# Patient Record
Sex: Female | Born: 1935 | Race: White | Hispanic: No | Marital: Married | Smoking: Never smoker
Health system: Southern US, Community
[De-identification: ages and names within clinical notes are randomized; demographics above are authoritative.]

---

## 1999-10-23 ENCOUNTER — Encounter: Admission: RE | Admit: 1999-10-23 | Discharge: 1999-10-23 | Payer: Self-pay | Admitting: Obstetrics and Gynecology

## 1999-10-23 ENCOUNTER — Encounter: Payer: Self-pay | Admitting: Obstetrics and Gynecology

## 2000-12-09 ENCOUNTER — Encounter: Payer: Self-pay | Admitting: Obstetrics and Gynecology

## 2000-12-09 ENCOUNTER — Encounter: Admission: RE | Admit: 2000-12-09 | Discharge: 2000-12-09 | Payer: Self-pay | Admitting: Obstetrics and Gynecology

## 2002-01-10 ENCOUNTER — Encounter: Payer: Self-pay | Admitting: Obstetrics and Gynecology

## 2002-01-10 ENCOUNTER — Encounter: Admission: RE | Admit: 2002-01-10 | Discharge: 2002-01-10 | Payer: Self-pay | Admitting: Obstetrics and Gynecology

## 2003-01-13 ENCOUNTER — Encounter: Admission: RE | Admit: 2003-01-13 | Discharge: 2003-01-13 | Payer: Self-pay | Admitting: Obstetrics and Gynecology

## 2003-01-13 ENCOUNTER — Encounter: Payer: Self-pay | Admitting: Obstetrics and Gynecology

## 2003-08-17 ENCOUNTER — Inpatient Hospital Stay (HOSPITAL_COMMUNITY): Admission: EM | Admit: 2003-08-17 | Discharge: 2003-08-19 | Payer: Self-pay | Admitting: Emergency Medicine

## 2003-08-30 ENCOUNTER — Ambulatory Visit (HOSPITAL_COMMUNITY): Admission: RE | Admit: 2003-08-30 | Discharge: 2003-08-30 | Payer: Self-pay | Admitting: Family Medicine

## 2003-08-31 ENCOUNTER — Inpatient Hospital Stay (HOSPITAL_COMMUNITY): Admission: EM | Admit: 2003-08-31 | Discharge: 2003-09-12 | Payer: Self-pay | Admitting: Emergency Medicine

## 2003-09-04 ENCOUNTER — Encounter (INDEPENDENT_AMBULATORY_CARE_PROVIDER_SITE_OTHER): Payer: Self-pay | Admitting: *Deleted

## 2003-09-07 ENCOUNTER — Encounter (INDEPENDENT_AMBULATORY_CARE_PROVIDER_SITE_OTHER): Payer: Self-pay | Admitting: *Deleted

## 2003-09-19 ENCOUNTER — Encounter: Admission: RE | Admit: 2003-09-19 | Discharge: 2003-09-19 | Payer: Self-pay | Admitting: Thoracic Surgery

## 2003-10-10 ENCOUNTER — Encounter: Admission: RE | Admit: 2003-10-10 | Discharge: 2003-10-10 | Payer: Self-pay | Admitting: Thoracic Surgery

## 2004-01-10 ENCOUNTER — Encounter: Admission: RE | Admit: 2004-01-10 | Discharge: 2004-01-10 | Payer: Self-pay | Admitting: Thoracic Surgery

## 2004-01-18 ENCOUNTER — Encounter: Admission: RE | Admit: 2004-01-18 | Discharge: 2004-01-18 | Payer: Self-pay | Admitting: Obstetrics and Gynecology

## 2005-02-04 ENCOUNTER — Encounter: Admission: RE | Admit: 2005-02-04 | Discharge: 2005-02-04 | Payer: Self-pay | Admitting: Obstetrics and Gynecology

## 2005-03-20 IMAGING — CR DG CHEST DECUBITUS*L*
1 series · 1 of 1 positions shown · non-contrast
Comparison: none

CLINICAL DATA: Pneumonia.
 CHEST, TWO VIEWS 09/03/03
 Two views of the chest are compared to a chest x-ray of 09/01/03.  There is little change in right pleural effusion and right basilar atelectasis.  Very small left effusion is noted.  The heart is within normal limits in size. 
 IMPRESSION
 Little change in volume of right pleural effusion with right basilar atelectasis.  Very small left effusion.
 CHEST LEFT DECUBITUS 09/03/03
 A left lateral decubitus view of the chest shows no significant free-flowing left effusion to be present. 
 No significant free-flowing left effusion. 
 CHEST RIGHT DECUBITUS 09/03/03
 A right lateral decubitus view of the chest shows a moderate size free-flowing right pleural effusion to be present. 
 Moderate size free-flowing right pleural effusion.

[view not recorded]
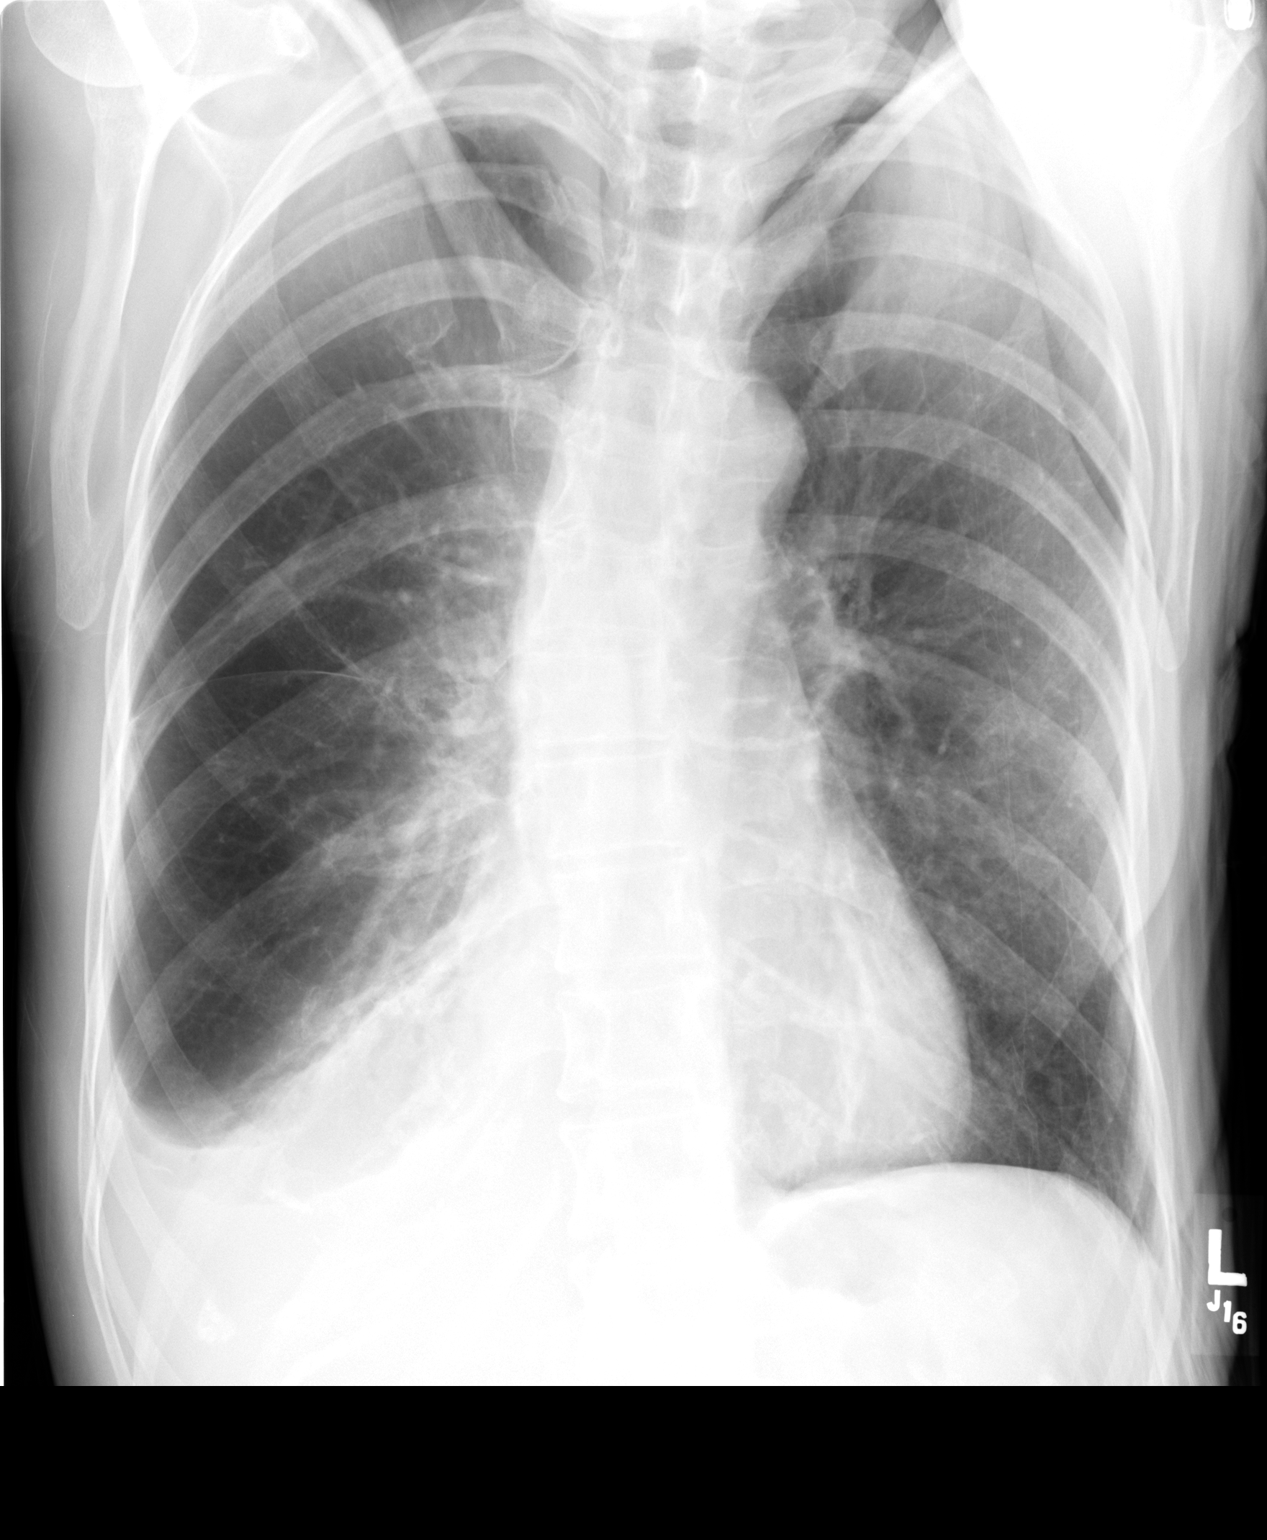

[1 of 1 positions shown; findings below may reference images not displayed]

## 2005-03-21 IMAGING — CR DG CHEST 1V PORT
1 series · 1 of 1 positions shown · non-contrast
Comparison: none

CLINICAL DATA: Post-bronchoscopy. 
 CHEST, PORTABLE ONE VIEW   
 An AP view of the chest made with the portable apparatus on September 04, 2003 is compared with a prior study of the previous day.  Large amount of effusion is still present on the right with some loss of volume of the right lower lobe with some increased density which may represent compression atelectasis.  No evidence of pneumothorax is noted. 
 IMPRESSION
 No evidence of pneumothorax after bronchoscopy.  Large right pleural effusion persists.

[view not recorded]
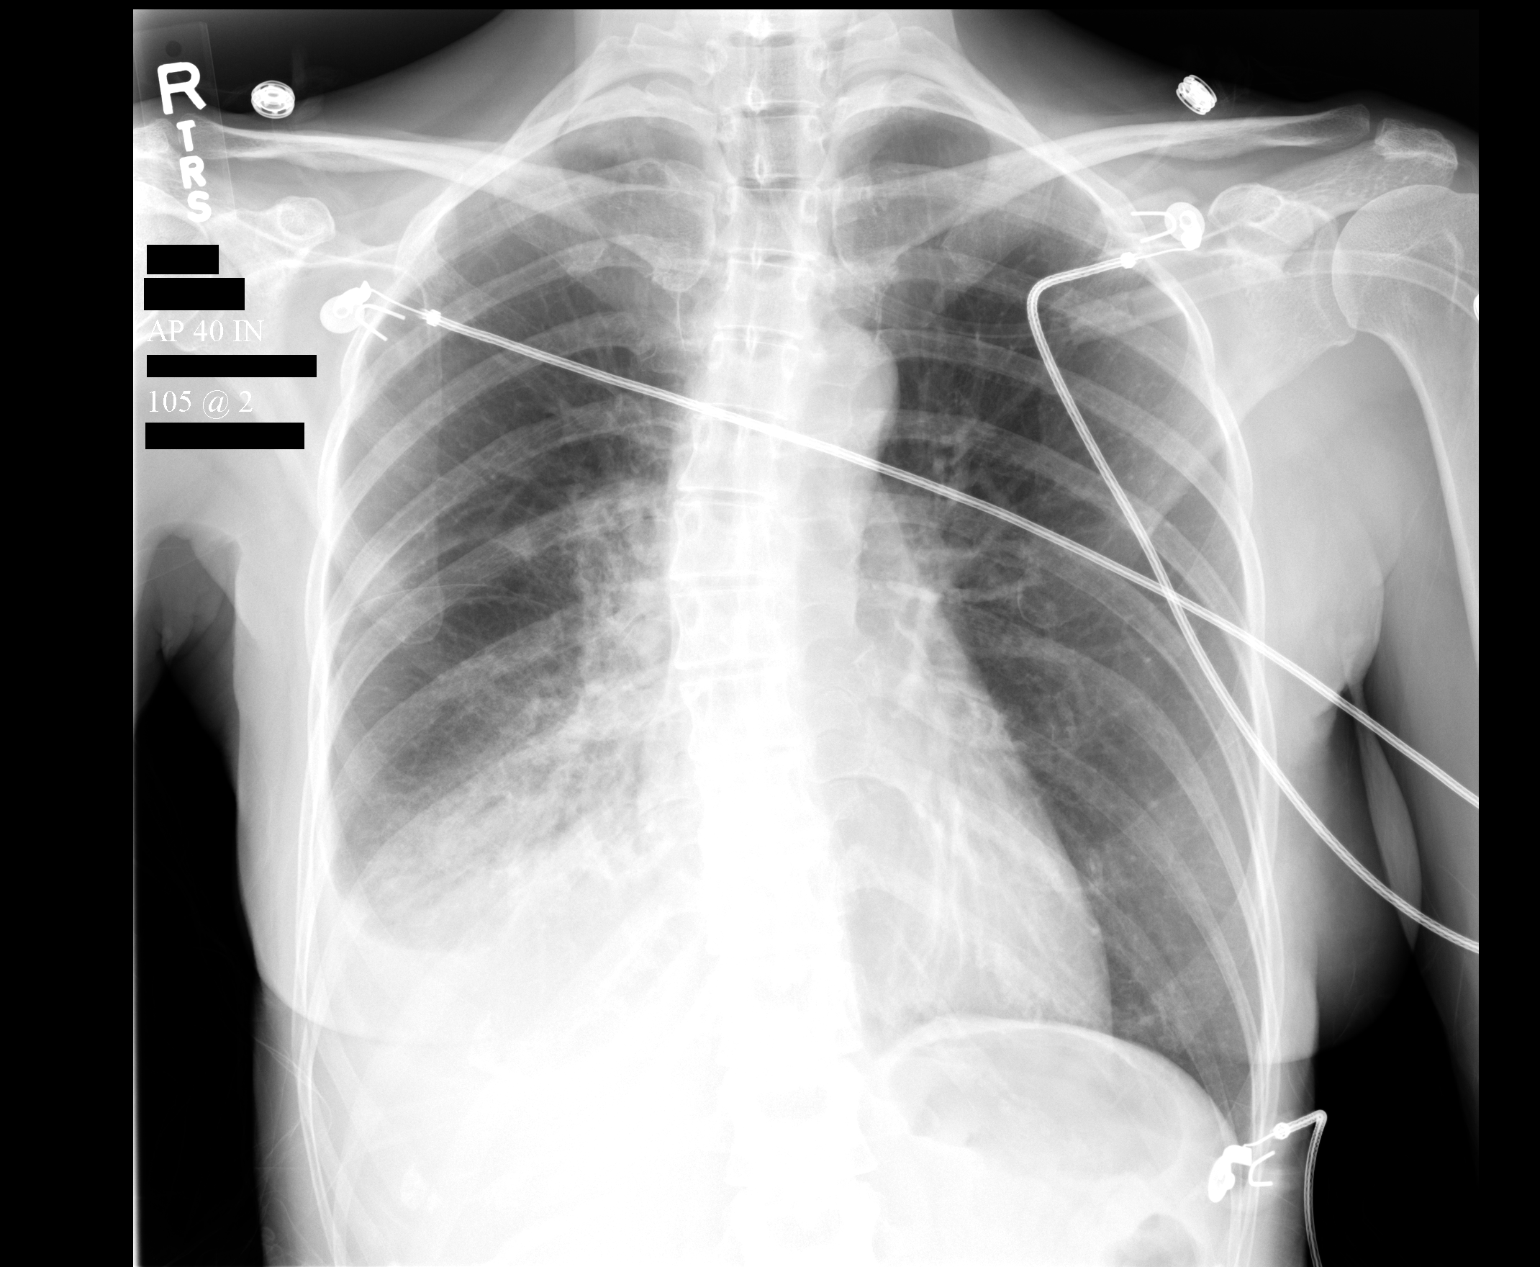

[1 of 1 positions shown; findings below may reference images not displayed]

## 2005-03-25 IMAGING — CR DG CHEST 1V PORT
1 series · 1 of 1 positions shown · non-contrast
Comparison: none

CLINICAL DATA: Pneumonia. Post thoracotomy.
 PORTABLE CHEST (4665 hours)
 Comparison 09/07/03.
 Two right-sided chest tubes are in stable position.  There is no pneumothorax.  There has been some re-accumulation of pleural fluid inferiorly in the right hemithorax.  Mild bibasilar pulmonary atelectasis is present.  The cardiomediastinal contours are unchanged.  Right-sided central line is in stable position.
 IMPRESSION
 Partial reaccumulation of right pleural effusion and worsening bibasilar atelectasis.  No pneumothorax.

[view not recorded]
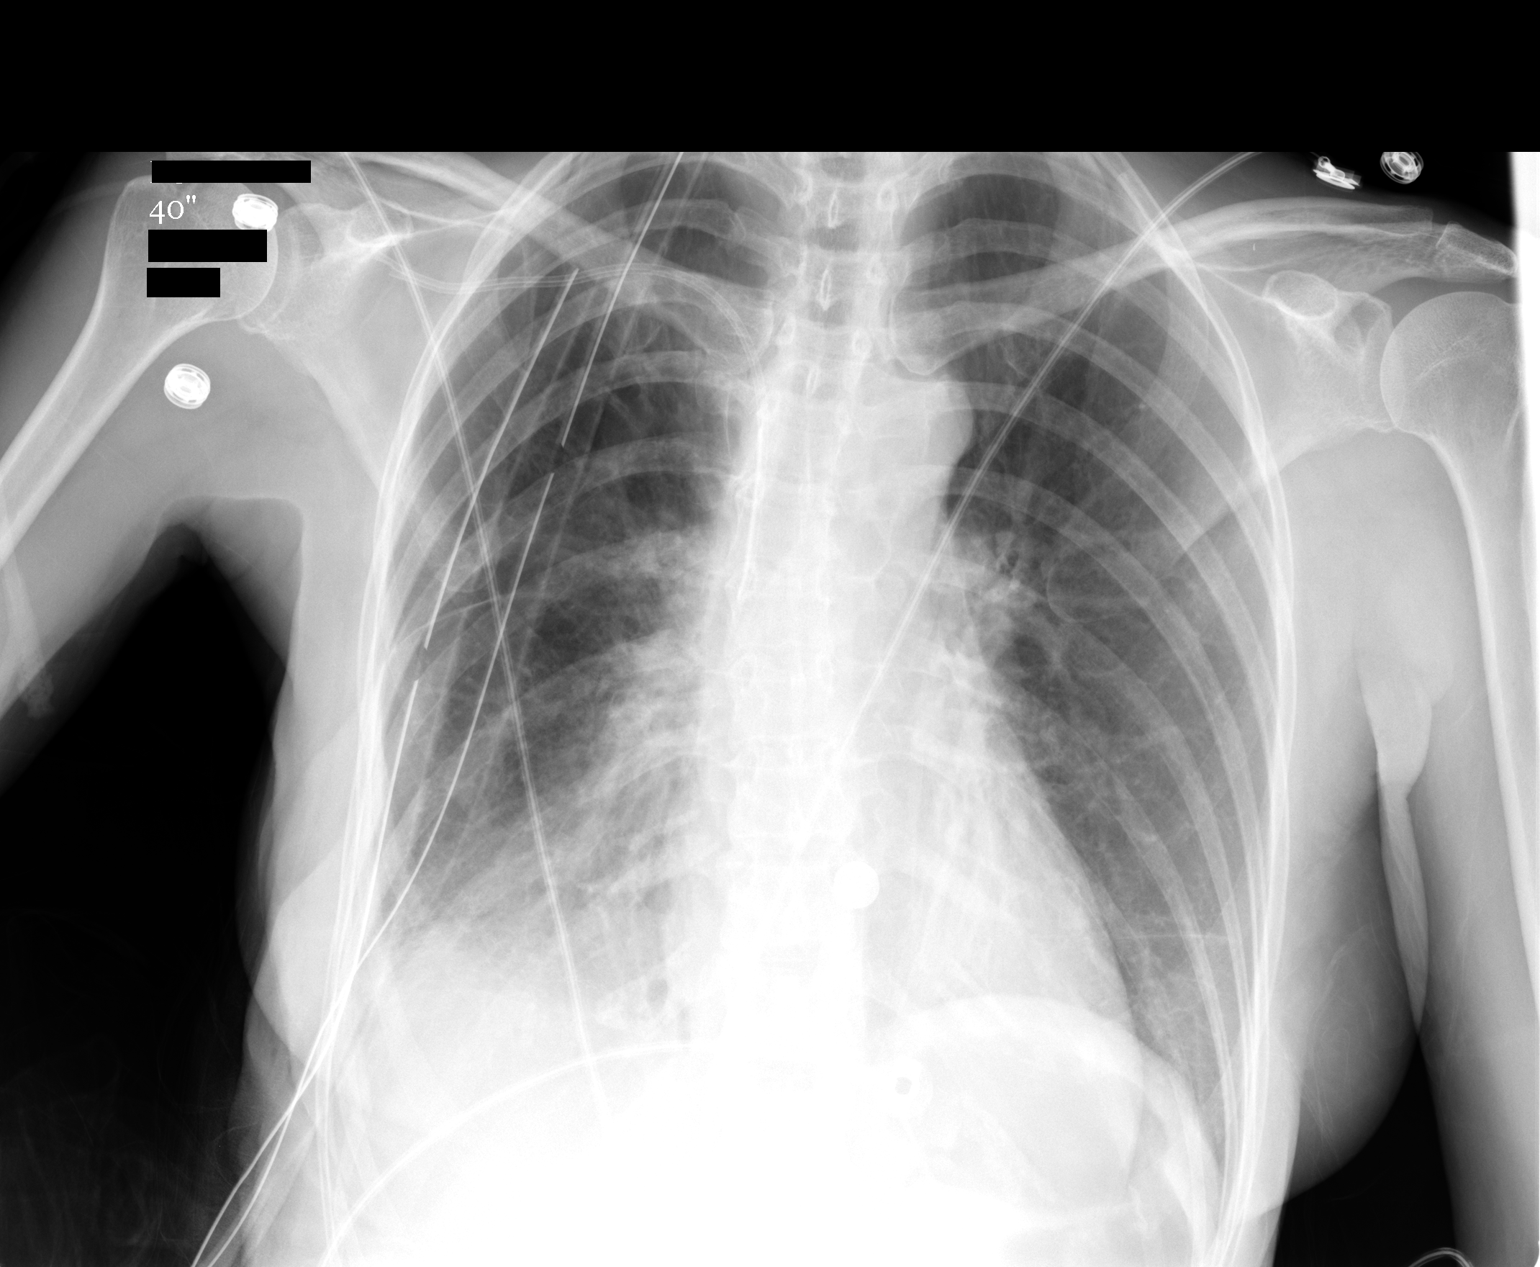

[1 of 1 positions shown; findings below may reference images not displayed]

## 2005-03-25 IMAGING — CR DG CHEST 1V PORT
1 series · 1 of 1 positions shown · non-contrast
Comparison: none

CLINICAL DATA: Pneumonia.  Follow-up chest tube. 
 PORTABLE CHEST 09/08/03 
 Exam at 7378 hours.  Comparison to earlier the same date and 09/07/03.  The two right sided chest tubes appear in stable position.  No pneumothorax is demonstrated.  Right basilar aeration appears mildly improved with a probable small residual pleural effusion on the right.  The left lung is clear.  There is no mediastinal shift or change in the cardiomediastinal contours.  Central line is in stable position. 
 IMPRESSION
 Improving right basilar aeration.  No pneumothorax.

[view not recorded]
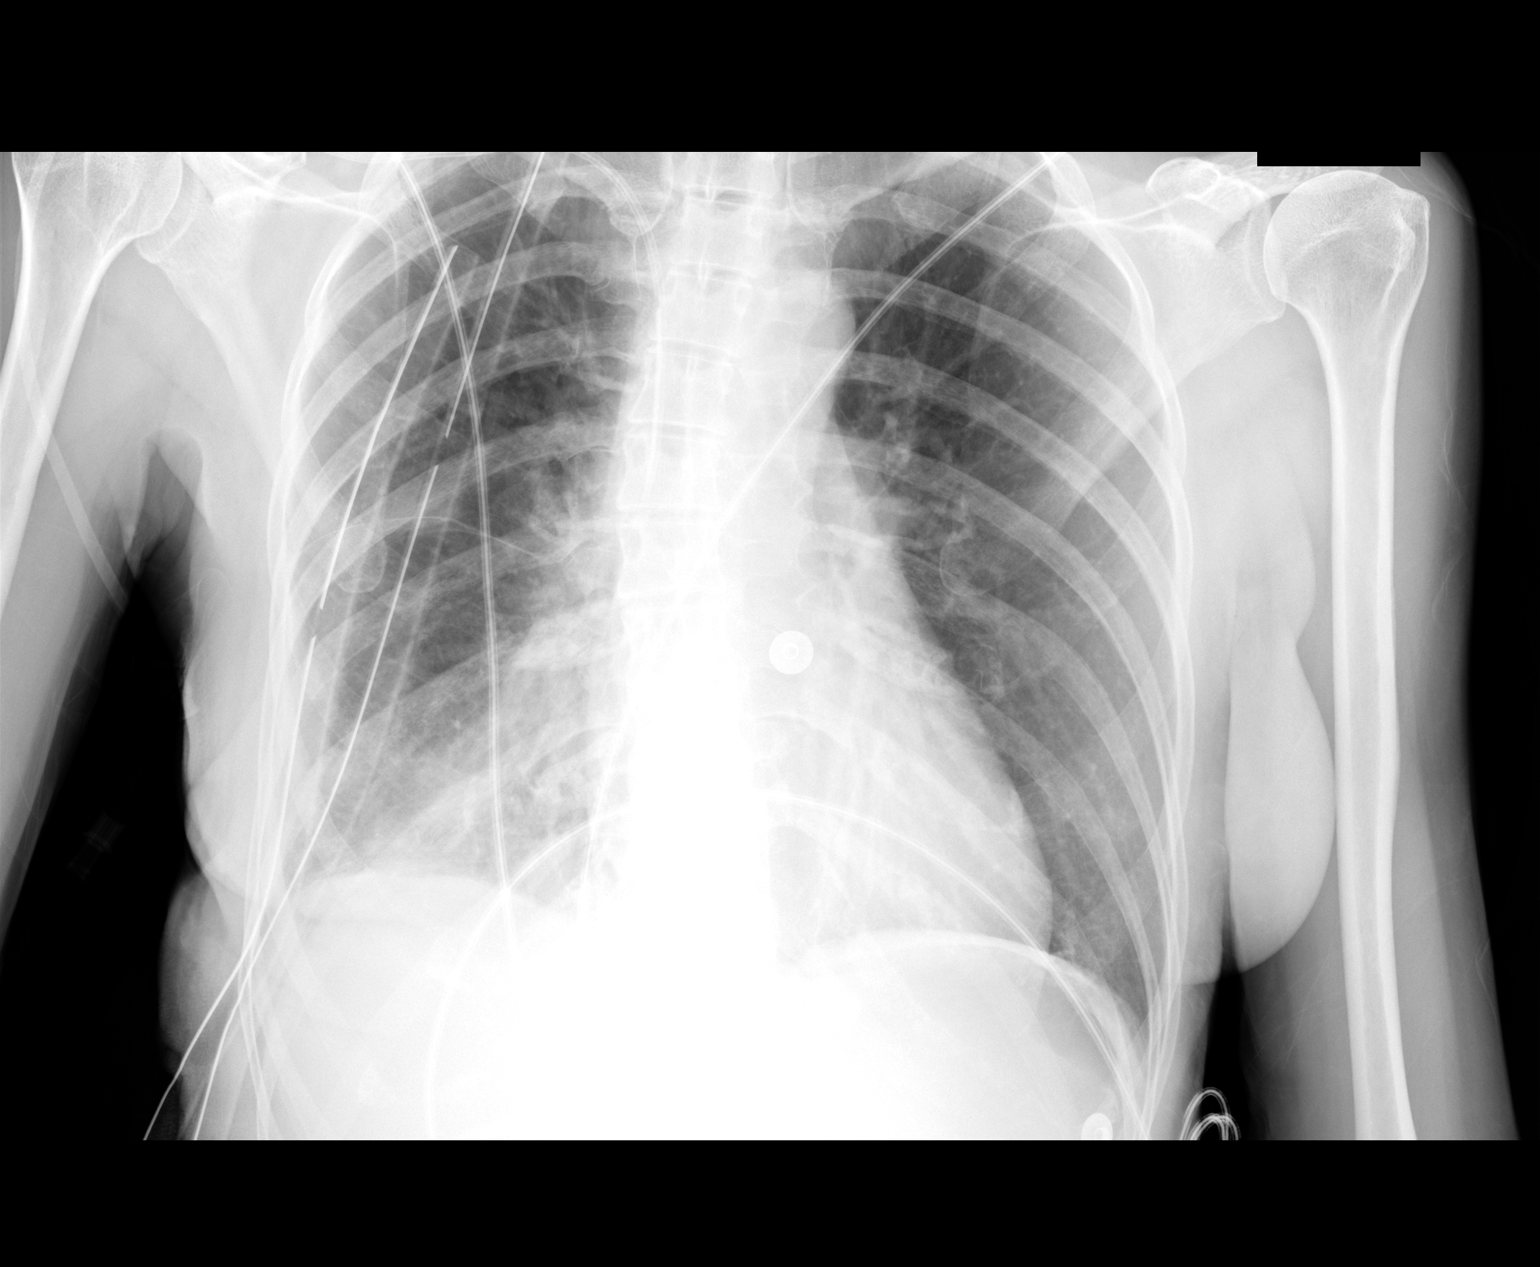

[1 of 1 positions shown; findings below may reference images not displayed]

## 2005-03-26 IMAGING — CR DG CHEST 1V PORT
1 series · 1 of 1 positions shown · non-contrast
Comparison: none

CLINICAL DATA: Follow-up aeration ? right thoracotomy. 
 PORTABLE CHEST 09/09/03 TAKEN AT 7987 HOURS
 Comparison to 09/08/03.  There is mild worsening of right basilar atelectasis.  Right sided chest tubes remain in satisfactory position.  There is a tiny right apical pneumothorax present.  The left lung is clear.  
 IMPRESSION
 Worsening right basilar atelectasis.  Tiny (less than 5 percent) right apical pneumothorax.

[view not recorded]
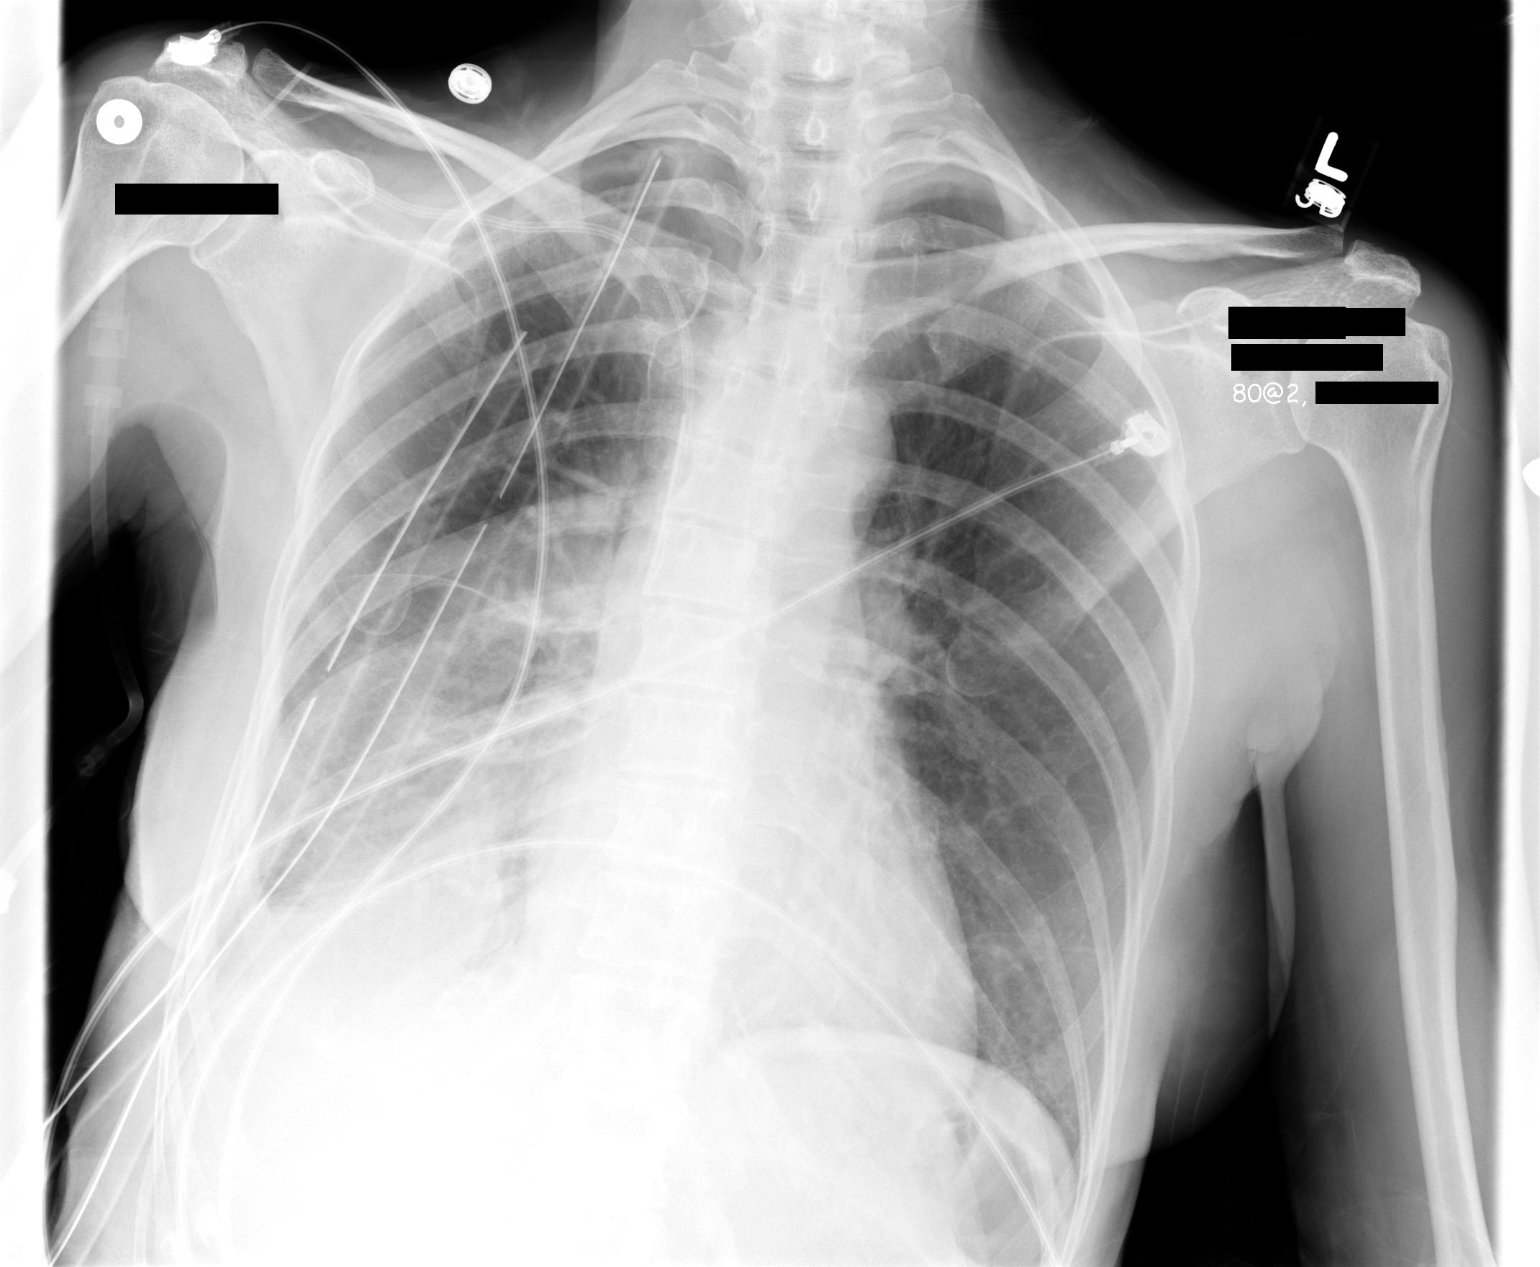

[1 of 1 positions shown; findings below may reference images not displayed]

## 2006-02-10 ENCOUNTER — Encounter: Admission: RE | Admit: 2006-02-10 | Discharge: 2006-02-10 | Payer: Self-pay | Admitting: Obstetrics and Gynecology

## 2007-02-12 ENCOUNTER — Encounter: Admission: RE | Admit: 2007-02-12 | Discharge: 2007-02-12 | Payer: Self-pay | Admitting: Obstetrics and Gynecology

## 2008-02-15 ENCOUNTER — Encounter: Admission: RE | Admit: 2008-02-15 | Discharge: 2008-02-15 | Payer: Self-pay | Admitting: Internal Medicine

## 2009-03-27 ENCOUNTER — Encounter: Admission: RE | Admit: 2009-03-27 | Discharge: 2009-03-27 | Payer: Self-pay | Admitting: Internal Medicine

## 2010-02-20 ENCOUNTER — Encounter: Admission: RE | Admit: 2010-02-20 | Discharge: 2010-02-20 | Payer: Self-pay | Admitting: Internal Medicine

## 2010-03-28 ENCOUNTER — Encounter: Admission: RE | Admit: 2010-03-28 | Discharge: 2010-03-28 | Payer: Self-pay | Admitting: Internal Medicine

## 2010-10-11 NOTE — Op Note (Signed)
NAME:  Sandra Chapman, Sandra Chapman                      ACCOUNT NO.:  [id number]   MEDICAL RECORD NO.:  [id number]                   PATIENT TYPE:  INP   LOCATION:  5706                                 FACILITY:  [facility]   PHYSICIAN:  Shan Levans, M.D. [facility]            DATE OF BIRTH:  1932-03-16   DATE OF PROCEDURE:  09/04/2003  DATE OF DISCHARGE:                                 OPERATIVE REPORT   PROCEDURE PERFORMED:  Fiberoptic bronchoscopy.   CHIEF COMPLAINT:  Recurrent right lower lobe pneumonia.   OPERATOR:  Shan Levans, M.D. [facility]   ANESTHESIA:  1% Xylocaine local.   PREOP MEDICATIONS:  Demerol 40 mg IV push, Versed 4 mg IV push.   DESCRIPTION OF PROCEDURE:  The Olympus video bronchoscope was introduced to  the right naris.  The upper airways were visualized, were unremarkable.  The  entire tracheobronchial tree was visualized and revealed diffuse  tracheobronchitis but no endobronchial lesion was seen in particular in the  right lower lobe or right middle lobe.  Bronchoalveolar lavage was obtained  150 mm volume infused approximately 80 mL volume returned.  Transbronchial  biopsies times five were obtained from the right lower lobe lateral segment.  After the fifth biopsy approximately 75 mL hemorrhage occurred.  This was  controlled with topical epinephrine and Thrombin.  At the end of the  procedure, there was no further bleeding encountered.   COMPLICATIONS:  75 mL hemorrhage as noted above.   IMPRESSION:  Right lower lobe recurrent pneumonia with pleural effusion.   RECOMMENDATIONS:  Follow-up pathology, microbiology.                                               Shan Levans, M.D. [facility]    PW/MEDQ  D:  09/04/2003  T:  09/05/2003  Job:  [id number]   cc:   Vania Rea, M.D.

## 2010-10-11 NOTE — Op Note (Signed)
NAME:  FRONNIE, URTON                      ACCOUNT NO.:  [id number]   MEDICAL RECORD NO.:  [id number]                   PATIENT TYPE:  INP   LOCATION:  2009                                 FACILITY:  [facility]   PHYSICIAN:  Ines Bloomer, M.D.              DATE OF BIRTH:  1932/01/20   DATE OF PROCEDURE:  09/07/2003  DATE OF DISCHARGE:  09/12/2003                                 OPERATIVE REPORT   PREOPERATIVE DIAGNOSIS:  Loculated parapneumonic infusion with empyema.   POSTOPERATIVE DIAGNOSIS:  Loculated parapneumonic effusion with empyema.   OPERATION PERFORMED:  1. Right video-assisted thoracoscopic surgery.  2. Drainage of effusion.  3. Pleural biopsies.  4. Debridement with decortication and right lower lobe lung biopsy.   SURGEON:  Ines Bloomer, M.D.   General anesthesia.   FIRST ASSISTANT:  Rowe Clack, P.A.-C.   After percutaneous insertion of all monitoring lines, the patient was turned  to the right lateral thoracotomy position and was prepped and draped in the  usual sterile manner.  This patient had a loculated right parapneumonic  effusion with a temperature and had had right lower lobe pneumonia.  He was  brought to the operating room for an empyema.  Two trocar sites were made in  the anterior and posterior axillary line at the seventh intercostal space.  Two trocars were inserted.  The 0 degree scope was inserted.  There were  multiple loculations and effusion.  The effusion was evacuated with sucker  and then the loculations were broken up using the [facility] ring forceps to  remove the exudate from the lung as well as from the pleura.  Part of the  exudate was also sent for culture.  A portion of the right lower lobe which  was inflamed was then biopsied using a third trocar site in the fifth  intercostal space at the anterior axillary line.  This area was grasped with  a #1 Kaiser ring forceps and then stapled and divided with the EZ-45 stapler  coming  in from the medial trocar site.  The rest of the exudate was taken  off the right lower lobe so the right lower lobe could re-expand.  After  this had been done, three chest tubes were inserted, a right angle chest  tube into the costophrenic angle through the trocar sites, and to the other  two trocar sites were inserted an anterior and posterior #28 chest tube.  They were sutured in place with 0 silk.  A Marcaine block was done in the  usual fashion.  A dry sterile dressing was applied.  The patient was  returned to the recovery room in stable condition.  Ines Bloomer, M.D.    DPB/MEDQ  D:  12/02/2003  T:  12/04/2003  Job:  [id number]

## 2010-10-11 NOTE — Discharge Summary (Signed)
NAME:  Sandra Chapman, Sandra Chapman                      ACCOUNT NO.:  [id number]   MEDICAL RECORD NO.:  [id number]                   PATIENT TYPE:  INP   LOCATION:  2009                                 FACILITY:  [facility]   PHYSICIAN:  Shan Levans, M.D. [facility]            DATE OF BIRTH:  Aug 14, 1931   DATE OF ADMISSION:  08/31/2003  DATE OF DISCHARGE:  09/12/2003                                 DISCHARGE SUMMARY   DISCHARGE DIAGNOSES:  Pneumonia, no organism specified with empyema status  post video-assisted thoracic surgery, decortication with gram-positive  cocci.   HISTORY OF PRESENT ILLNESS:  The patient is a 77 year old white female who  initially was admitted to [facility] by Vania Rea, M.D.  This 77 year old female has recently been discharged on August 17, 2003, with  a two-day visit for right-sided lobar pneumonia.  She is discharged on 12  days of Avelox which she had taken faithfully and completed the day before  admission.  On April 5, she visited her primary care physician for the first  time.  He sent her for a chest x-ray on the day prior to admission.  Chest x-  ray showed a right pleural effusion, worsening pneumonia, and she was sent  for admission and investigation.  She was also started on Ketac.  Pulmonary  critical care was asked to evaluate and Dr. Delford Field did do a pulmonary  consult on September 01, 2003, and subsequently took her under the pulmonary  critical care service.   LABORATORY DATA:  Blood culture shows no growth after three days.  Tissue  culture shows no growth.  Sodium 137, potassium 3.5, chloride 104, CO2 28,  glucose 83, BUN 9, creatinine 0.6, calcium 8.1.  CBC showed WBC 5.8,  hemoglobin 10.6, hematocrit 30.8, and platelets were 428.  Arterial blood  gases on room air; pH 7.37, pCO2 41, pO2 79 with a bicarb of 23.  INR was  1.0, PTT 34, AST 63, ALT 43, ALP 229, total bilirubin 0.8.  Pleural fluid  showed a WBC of 5700 with a neutrophil count of 83,  lymph of 9, macrophages  of 8, protein 3.7, glucose 97, and LDH 214.  Urine culture showed  insignificant growth.  AFB showed no acid-fast bacilli.  Report of  cytopathology shows no malignant cells, no atypical, no evidence of  malignancy.  Right lower lung films bronchoscopic biopsy demonstrates benign  pulmonary parenchyma which shows atelectasis without evidence of infection.   PROCEDURE:  1. Shan Levans, M.D. [facility] performed a fiberoptic bronchoscopy with     recurrent right lower lobe pneumonia.  This noted 75 mL of hemorrhage.     Right lower lobe recurrent pneumonia with pleural effusion.  2. Ines Bloomer, M.D. on April 14, performed video-assisted thoracic     surgery with drainage of effusion, takedown of adhesions, and right lower     lobe biopsy.  Assistant of Rowe Clack, P.A.-C.  She did undergo     general endotracheal anesthesia and had two 28 French chest tubes     inserted which have subsequently been removed.   HOSPITAL COURSE:  Problem 1.  Pneumonia, no organism specified with empyema.  She underwent VATS with decortication on April 14, by Dr. Edwyna Shell after  undergoing fiberoptic bronchoscopy earlier by Dr. Delford Field.  She had reached  maximal hospital benefit by September 12, 2003, and was ready for discharge  home.  She will follow up with Dr. Edwyna Shell on April 26, for her right video-  assisted thoracic surgery intervention and chest x-ray will be taken at that  time.  She will also follow up with Dr. Delford Field on Oct 02, 2003, at 2:50 p.m.   DISCHARGE MEDICATIONS:  1. Avelox 400 mg a day until gone.  2. Synthroid 75 mcg one a day.  3. Advair 250/50 one puff two times a day.  4. Ultram 50 mg one or two tablets four times a day p.r.n. as needed for     pain.   ACTIVITY:  No driving and no heavy lifting.  She is able to walk without  exertion.   WOUND CARE:  She may shower and clean gently with soap and water.   DISCHARGE INSTRUCTIONS:  Call for any problems.  She  has follow-up  appointments with Dr. Delford Field on Oct 02, 2003, at 2:50 p.m. and Dr. Edwyna Shell on  September 19, 2003, at 2:00 p.m.   CONDITION ON DISCHARGE:  She is being discharged home in improved condition.      Brett Canales Minor, A.C.N.P. [facility]                 Shan Levans, M.D. [facility]    SM/MEDQ  D:  09/12/2003  T:  09/12/2003  Job:  [id number]   cc:   Vania Rea, M.D.

## 2010-10-11 NOTE — H&P (Signed)
NAME:  Sandra Chapman, Sandra Chapman                      ACCOUNT NO.:  [id number]   MEDICAL RECORD NO.:  [id number]                   PATIENT TYPE:  INP   LOCATION:  1824                                 FACILITY:  [facility]   PHYSICIAN:  Hettie Holstein, D.O.                 DATE OF BIRTH:  07-28-1931   DATE OF ADMISSION:  08/17/2003  DATE OF DISCHARGE:                                HISTORY & PHYSICAL   PRIMARY CARE PHYSICIAN:  Unassigned.   CHIEF COMPLAINT:  Right-sided intractable pleuritic chest pain.   HISTORY OF PRESENT ILLNESS:  This is a 77 year old Caucasian female without  significant past medical history except for hypothyroidism on thyroid  replacement therapy, who, over the past week, has developed a cough that had  initially gotten better over the course of the week up until the past three  days when she developed some pleuritic type right subcostal pain and  subjective chills and cough productive of yellow sputum.  Her husband has  been ill at home and has been on antibiotics as well.  She denies any other  associated complaints.  [facility], was thought  to have a right-sided pneumonia of chest radiograph, and she was  subsequently directed to [facility] ER.  She was noted to have right-sided  infiltrate, was tachycardic, and had a mildly elevated WBC in the emergency  department, and she was further directed for admission.   PAST MEDICAL HISTORY:  Only significant for thyroid biopsy that wsz not  malignant.  She denies any history of lung disease, heart disease, renal  disease, liver disease.  No prior surgeries, no prior hysterectomy,  cholecystectomy, or appendectomy.  She has no prior hospitalizations.   HOME MEDICATIONS:  1. Synthroid 100 mcg p.o. daily.  2. Prempro 3/1.5 daily.   ALLERGIES:  PENICILLIN and SULFA which cause hives.   SOCIAL HISTORY:  She is a former bookkeeper.  She is married.  She denies  alcohol or tobacco.   FAMILY  HISTORY:  Noncontributory with exception of mother who has CVA and  pulmonary embolus.  Father died at an elderly age.   REVIEW OF SYSTEMS:  She denies weight loss. She has had loss of appetite  just with this recent illness.  No nausea, vomiting, diarrhea, hemoptysis,  hematochezia, coffee grand emesis.  Preventative screening is not up to  date.  She has not had colonoscopy and cannot recall last mammogram.   PHYSICAL EXAMINATION:  VITAL SIGNS:  Blood pressure 136/58.  She was  slightly tachycardic in the emergency department with heart rate of 98 to  the 188 range.  O2 saturation on room air was 94%, respirations 22 to 24,  temperature 97.9.  GENERAL:  This is a very thin-appearing, non-toxic in appearance Caucasian  female in no acute distress.  She is very thin.  CHEST:  Clear bilaterally with diminished breath sounds in the right  base.  No egophony and no dullness to percussion.  HEART:  She is tachycardic, no appreciable murmur.  ABDOMEN:  Soft.  She does exhibit some slight CVA tenderness.  EXTREMITIES:  No edema.  Peripheral pulses are palpable bilaterally.  NEUROLOGIC:  Affect euthymic, mood stable.   LABORATORY AND X-RAY DATA:  At time of my evaluation, she had WBC 1.3,  hemoglobin 15.2, hematocrit 44, MCV 89, platelet count 179.  Sodium 142,  potassium 4.2, chloride 105, CO2 28, glucose 147, BUN 20, creatinine 0.9,  calcium 8.6, albumin 3.1, AST 45, ALT 29, alkaline phosphatase 88, total  bilirubin 1.0.   EKG revealed sinus tachycardia with left axis deviation.   Chest x-ray revealed right-sided infiltrate/pneumonia/   IMPRESSION:  1. Community-acquired pneumonia, right lower lobe.  2. Leukocytosis presumable secondary to above.  3. Mildly elevated AST.  4. Low albumin.  5. Hypoxia.  6. Tachycardia.  7. Pleuritic chest discomfort.  8. Elevated glucose on chemistry panel.   PLAN:  1. We are going to admit Mrs. Spinella for IV therapy with Avelox for      community-acquired pneumonia.  2. We are going to make some other screening studies including urinalysis,     TSH. We are going to check a fasting lipid panel as well as a hemoglobin     A1C.                                                Hettie Holstein, D.O.    ESS/MEDQ  D:  08/17/2003  T:  08/18/2003  Job:  [id number]

## 2010-10-11 NOTE — H&P (Signed)
NAME:  Sandra Chapman, Sandra Chapman                      ACCOUNT NO.:  [id number]   MEDICAL RECORD NO.:  [id number]                   PATIENT TYPE:  OUT   LOCATION:  XRAY                                 FACILITY:  [facility]   PHYSICIAN:  Vania Rea, M.D.              DATE OF BIRTH:  Nov 30, 1931   DATE OF ADMISSION:  08/31/2003  DATE OF DISCHARGE:                                HISTORY & PHYSICAL   CHIEF COMPLAINT:  Progression of right sided pneumonia.   HISTORY OF PRESENT ILLNESS:  This 77 year old Caucasian lady recently  discharged (August 17, 2003), after a two-day visit for right sided lobar  pneumonia.  The patient was discharged on twelve days of Avelox which she  has taken faithfully and completed yesterday.  On April 5, she visited her  primary care physician the first time.  He sent her to get a chest x-ray  yesterday.  That chest x-ray showed a right pleural effusion, worsening of  the pneumonia and he sent her for admission for further investigation.  Also  started her on Ketek which she has taken one dose so far.   The patient is coughing less with Robitussin.  The cough is nonproductive.  She continues to have right-sided pleuritic chest pain.  She is having no  fevers.  She is having mild anorexia, and has probably lost a pound over the  past 12 days that she has been out of the hospital.  She has no hemoptysis.  She has no urinary problems, and no GI problems.   PAST MEDICAL HISTORY:  1. Significant for hypothyroidism.  2. Status post non-malignant thyroid surgery.  3. Osteoporosis.  4. Recent history of pneumonia.   MEDICATIONS:  1. Synthroid 100 micrograms daily.  2. Prempro 0.5/ 1.5 daily.  3. Avelox 400 mg daily x12 days, completed yesterday.  4. Ketek daily, one tablet yesterday.  5. Calcium 600 mg daily.  6. Centrum Silver one tablet daily.  7. Robitussin p.r.n. for coughing.   ALLERGIES:  PENICILLIN AND SULFA caused hives.   SOCIAL HISTORY:  She is a retired  Catering manager and married.  She denies  tobacco, alcohol or illicit drug use.   FAMILY HISTORY:  Mother died of a stroke and pulmonary embolus at age 20.  Her father died at age 24 from cancer of the liver.   REVIEW OF SYSTEMS:  She denies headache.  She occasionally has sinusitis.  She has no visual problems.  She has no diabetes.  Before the onset of this  illness, she has no chest pain, palpitations, orthopnea or PND.  She  currently does have orthopnea.  No PND, but she does have dyspnea on  exertion.  No lower-extremity edema.  No nausea, vomiting, diarrhea,  constipation, weight loss or hematochezia.  There has been no change in her  preventative screenings.  She has no frequency, dysuria or hematuria.   PHYSICAL EXAMINATION:  GENERAL:  This is an  elderly Caucasian lady, somewhat  thin, sitting up in the stretcher, not in severe respiratory distress, but  grateful for the pain medication which she has had.  VITAL SIGNS:  Temperature is 97.8, pulse 110, respirations 20, blood  pressure 147/64.  Pain is currently a 2/10.  She is saturating at 94% on  room air.  HEENT:  She is pink and anicteric.  Pupils are equal and reactive to light.  There is no jugular venous distension.  She has an anterior thyroid surgical  scar.  There is no lymphadenopathy.  CHEST:  She has decreased breath sounds over the entire right chest. She has  increased vocal fremitus in the upper half posteriorly, and decreased vocal  fremitus in the lower half of her chest posteriorly, and stoney dullness in  the lower half of her chest posteriorly.  CARDIOVASCULAR:  She is tachycardic.  She has a systolic ejection murmur.  ABDOMEN:  Soft, nontender.  EXTREMITIES:  Thin.  She has 3+ pulses bilaterally.  CNS:  She is alert and oriented x3.  There are no focal deficits.   LABORATORY DATA:  White count is 10.3, hemoglobin 12.0, hematocrit 35.2.  MCHC is 34, platelet count 606.  She has a normal differential.  ABG  shows  pH of 7.5, pCO2 of 29.9, pO2 of 63, saturating at 95% on room air.  Sodium  134, potassium 4.1, chloride 104, cO2 25, glucose 101.  BUN 11, creatinine  0.7, calcium 8.1.  Total protein 5.5.  Albumin 2.2.  SGOT 63, slightly  elevated.  ALT 43, slightly elevated.  Her alkaline phosphatase was 229  elevated. Bilirubin is 0.8.   Chest x-rays shows worsening of the right lower lobe consolidation with now  small right pleural effusion.   ASSESSMENT:  1. Worsening right lower-lobe pneumonia.  2. Persistent right-sided pleurisy.  3. Right pleural effusion, rule out empyema.  4. Tachycardia probably secondary to dehydration.  5. There may also possibly be some infraction of her thyroxin with her     medications causing supratherapeutic T4 levels.  6. Worsening liver function tests since previous admission, possibly a side     effect of the Avelox, possibly an effect of atypical pneumonia such as     mycoplasma.   PLAN:  1. We will restart her on intravenous Avelox and add Zithromax     intravenously.  2. We will get decubitus film this evening.  3. We will arrange for radiologist to tap the fluid and get some cultures     done on it, and microscopy.  If we can not get enough fluid, we will     arrange for pulmonary, and the patient prefers to [facility] to review her     situation and possibly get bronchoscopy to rule out an obstructive     component to this pneumonia.  4. What is of interest is on her initial admission, her white count was only     11.5, and one wonders if this is indeed a primarily bacterial process.                                                Vania Rea, M.D.    LC/MEDQ  D:  08/31/2003  T:  08/31/2003  Job:  [id number]

## 2010-10-11 NOTE — Discharge Summary (Signed)
NAME:  Sandra Chapman, Sandra Chapman                      ACCOUNT NO.:  [id number]   MEDICAL RECORD NO.:  [id number]                   PATIENT TYPE:  INP   LOCATION:  5156                                 FACILITY:  [facility]   PHYSICIAN:  Ara D. Metjian, M.D.                DATE OF BIRTH:  09-Apr-1932   DATE OF ADMISSION:  08/17/2003  DATE OF DISCHARGE:  08/19/2003                                 DISCHARGE SUMMARY   ATTENDING PHYSICIAN:  Ara D. Tammi Klippel, M.D.   PRIMARY CARE PHYSICIAN:  Unassigned.   FINAL DIAGNOSES:  1. Bilateral lower lobe pneumonia.  2. Hypothyroidism.  3. Status post menopause.   FINAL PROCEDURES:  1. Chest x-ray performed on August 17, 2003, showing right base pneumonia.  2. CT of the chest performed on August 18, 2003, showing no evidence of     pulmonary embolism, extensive right lower lobe pneumonia with associated     small right pleural effusion, additional smaller left lower lobe     infiltrate.   PERTINENT LABORATORY AND OTHER TEST RESULTS:  White blood cells 10,  hemoglobin and hematocrit 11.9/34.9, platelet count 212,000, MCV 90. D-dimer  1.74. Sodium 139, potassium 3.3, chloride 112, carbon dioxide 22, BUN 12,  creatinine 0.7, glucose 88. Total bilirubin 1.0, alkaline phosphatase 88,  AST 45, ALT 29, total protein 6.4, albumin 3.1, calcium 7.4, hemoglobin A1C  5.5. TSH 0.133 with T4 within normal limits at 11.2. Urinalysis was  remarkable only for trace blood and rare squamous epithelial cells.  Blood  cultures times two show no growth after two days.   SUMMARY OF HOSPITAL COURSE:  The patient is a very pleasant 77 year old  white female with no significant past medical history who was admitted for  pleuritic chest pain which was felt to be secondary to severe pneumonia as  seen on her CT scan which was also performed to a rule out pulmonary  embolism in the setting of a mildly elevated D-dimer. The patient did well  while she was in house, receiving IV fluids,  was able to ambulate without  any abnormality, and on the day of discharge the patient was feeling well  enough to go home. There were no in hospital episodes of acute mania or  psychosis. No episodes of hypoxemia, orthopnea, or PND. There were no signs  or symptoms of cardiac ischemia during admission.   DISCHARGE MEDICATIONS:  1. Synthroid 100 mcg p.o. daily.  2. Prempro 0.3/1.5 one tab p.o. daily.  3. Moxifloxacin 400 mg p.o. daily times twelve days.  4. Robitussin with codeine 10 cc q.4h. p.r.n. cough; no refills given.   DISCHARGE INSTRUCTIONS:  1. The patient is to take her medications as prescribed.  2. She is to engage in nonstrenuous activity for at least ten days.  3. She is to take copies of her medical records and find herself a primary     care physician.  4. She  is to return if she feels worse in any way.                                                Ara D. Tammi Klippel, M.D.    ADM/MEDQ  D:  08/19/2003  T:  08/19/2003  Job:  [id number]

## 2011-02-27 ENCOUNTER — Other Ambulatory Visit: Payer: Self-pay | Admitting: Internal Medicine

## 2011-02-27 DIAGNOSIS — Z1231 Encounter for screening mammogram for malignant neoplasm of breast: Secondary | ICD-10-CM

## 2011-04-03 ENCOUNTER — Ambulatory Visit
Admission: RE | Admit: 2011-04-03 | Discharge: 2011-04-03 | Disposition: A | Discharge status: Home / Self Care | Payer: Medicare PPO | Source: Ambulatory Visit | Attending: Internal Medicine | Admitting: Internal Medicine

## 2011-04-03 DIAGNOSIS — Z1231 Encounter for screening mammogram for malignant neoplasm of breast: Secondary | ICD-10-CM

## 2012-03-02 ENCOUNTER — Other Ambulatory Visit: Payer: Self-pay | Admitting: Internal Medicine

## 2012-03-08 ENCOUNTER — Other Ambulatory Visit: Payer: Self-pay | Admitting: Internal Medicine

## 2012-03-08 DIAGNOSIS — Z13820 Encounter for screening for osteoporosis: Secondary | ICD-10-CM

## 2012-03-08 DIAGNOSIS — Z780 Asymptomatic menopausal state: Secondary | ICD-10-CM

## 2012-03-09 ENCOUNTER — Other Ambulatory Visit: Payer: Self-pay | Admitting: Internal Medicine

## 2012-03-09 DIAGNOSIS — Z1231 Encounter for screening mammogram for malignant neoplasm of breast: Secondary | ICD-10-CM

## 2012-04-19 ENCOUNTER — Ambulatory Visit
Admission: RE | Admit: 2012-04-19 | Discharge: 2012-04-19 | Disposition: A | Discharge status: Home / Self Care | Payer: Medicare Other | Source: Ambulatory Visit | Attending: Internal Medicine | Admitting: Internal Medicine

## 2012-04-19 DIAGNOSIS — Z1231 Encounter for screening mammogram for malignant neoplasm of breast: Secondary | ICD-10-CM

## 2012-04-19 DIAGNOSIS — Z780 Asymptomatic menopausal state: Secondary | ICD-10-CM

## 2012-04-19 DIAGNOSIS — Z13820 Encounter for screening for osteoporosis: Secondary | ICD-10-CM

## 2013-03-14 ENCOUNTER — Other Ambulatory Visit: Payer: Self-pay

## 2013-03-14 DIAGNOSIS — Z1231 Encounter for screening mammogram for malignant neoplasm of breast: Secondary | ICD-10-CM

## 2013-04-20 ENCOUNTER — Ambulatory Visit
Admission: RE | Admit: 2013-04-20 | Discharge: 2013-04-20 | Disposition: A | Discharge status: Home / Self Care | Payer: Medicare Other | Source: Ambulatory Visit

## 2013-04-20 DIAGNOSIS — Z1231 Encounter for screening mammogram for malignant neoplasm of breast: Secondary | ICD-10-CM

## 2014-03-16 ENCOUNTER — Other Ambulatory Visit: Payer: Self-pay

## 2014-03-16 DIAGNOSIS — Z1231 Encounter for screening mammogram for malignant neoplasm of breast: Secondary | ICD-10-CM

## 2014-04-24 ENCOUNTER — Ambulatory Visit
Admission: RE | Admit: 2014-04-24 | Discharge: 2014-04-24 | Disposition: A | Discharge status: Home / Self Care | Payer: Medicare HMO | Source: Ambulatory Visit

## 2014-04-24 DIAGNOSIS — Z1231 Encounter for screening mammogram for malignant neoplasm of breast: Secondary | ICD-10-CM

## 2015-03-19 ENCOUNTER — Other Ambulatory Visit: Payer: Self-pay

## 2015-03-19 DIAGNOSIS — Z1231 Encounter for screening mammogram for malignant neoplasm of breast: Secondary | ICD-10-CM

## 2015-04-26 ENCOUNTER — Ambulatory Visit
Admission: RE | Admit: 2015-04-26 | Discharge: 2015-04-26 | Disposition: A | Discharge status: Home / Self Care | Payer: Medicare HMO | Source: Ambulatory Visit

## 2015-04-26 DIAGNOSIS — Z1231 Encounter for screening mammogram for malignant neoplasm of breast: Secondary | ICD-10-CM

## 2016-03-25 ENCOUNTER — Other Ambulatory Visit: Payer: Self-pay | Admitting: Internal Medicine

## 2016-03-25 DIAGNOSIS — Z1231 Encounter for screening mammogram for malignant neoplasm of breast: Secondary | ICD-10-CM

## 2016-05-13 ENCOUNTER — Ambulatory Visit
Admission: RE | Admit: 2016-05-13 | Discharge: 2016-05-13 | Disposition: A | Discharge status: Home / Self Care | Payer: Medicare HMO | Source: Ambulatory Visit | Attending: Internal Medicine | Admitting: Internal Medicine

## 2016-05-13 DIAGNOSIS — Z1231 Encounter for screening mammogram for malignant neoplasm of breast: Secondary | ICD-10-CM

## 2017-04-02 ENCOUNTER — Other Ambulatory Visit: Payer: Self-pay | Admitting: Internal Medicine

## 2017-04-02 DIAGNOSIS — Z139 Encounter for screening, unspecified: Secondary | ICD-10-CM

## 2017-05-14 ENCOUNTER — Ambulatory Visit
Admission: RE | Admit: 2017-05-14 | Discharge: 2017-05-14 | Disposition: A | Discharge status: Home / Self Care | Payer: Medicare HMO | Source: Ambulatory Visit | Attending: Internal Medicine | Admitting: Internal Medicine

## 2017-05-14 DIAGNOSIS — Z139 Encounter for screening, unspecified: Secondary | ICD-10-CM

## 2018-03-17 ENCOUNTER — Other Ambulatory Visit: Payer: Self-pay | Admitting: Internal Medicine

## 2018-03-17 DIAGNOSIS — Z1231 Encounter for screening mammogram for malignant neoplasm of breast: Secondary | ICD-10-CM

## 2018-03-17 DIAGNOSIS — E2839 Other primary ovarian failure: Secondary | ICD-10-CM

## 2018-05-27 ENCOUNTER — Ambulatory Visit
Admission: RE | Admit: 2018-05-27 | Discharge: 2018-05-27 | Disposition: A | Discharge status: Home / Self Care | Payer: Medicare HMO | Source: Ambulatory Visit | Attending: Internal Medicine | Admitting: Internal Medicine

## 2018-05-27 DIAGNOSIS — Z1231 Encounter for screening mammogram for malignant neoplasm of breast: Secondary | ICD-10-CM

## 2018-05-27 DIAGNOSIS — E2839 Other primary ovarian failure: Secondary | ICD-10-CM

## 2019-04-19 ENCOUNTER — Other Ambulatory Visit: Payer: Self-pay | Admitting: Internal Medicine

## 2019-04-19 DIAGNOSIS — Z1231 Encounter for screening mammogram for malignant neoplasm of breast: Secondary | ICD-10-CM

## 2019-06-10 ENCOUNTER — Other Ambulatory Visit: Payer: Self-pay

## 2019-06-10 ENCOUNTER — Ambulatory Visit
Admission: RE | Admit: 2019-06-10 | Discharge: 2019-06-10 | Disposition: A | Discharge status: Home / Self Care | Payer: Medicare HMO | Source: Ambulatory Visit | Attending: Internal Medicine | Admitting: Internal Medicine

## 2019-06-10 DIAGNOSIS — Z1231 Encounter for screening mammogram for malignant neoplasm of breast: Secondary | ICD-10-CM

## 2020-04-30 ENCOUNTER — Other Ambulatory Visit: Payer: Self-pay | Admitting: Internal Medicine

## 2020-04-30 DIAGNOSIS — Z1231 Encounter for screening mammogram for malignant neoplasm of breast: Secondary | ICD-10-CM

## 2020-06-13 ENCOUNTER — Ambulatory Visit: Payer: Medicare HMO

## 2020-10-04 ENCOUNTER — Ambulatory Visit (INDEPENDENT_AMBULATORY_CARE_PROVIDER_SITE_OTHER): Payer: Medicare HMO | Admitting: Otolaryngology

## 2020-10-04 ENCOUNTER — Encounter (INDEPENDENT_AMBULATORY_CARE_PROVIDER_SITE_OTHER): Payer: Self-pay | Admitting: Otolaryngology

## 2020-10-04 ENCOUNTER — Other Ambulatory Visit: Payer: Self-pay

## 2020-10-04 VITALS — Temp 97.2°F

## 2020-10-04 DIAGNOSIS — H6123 Impacted cerumen, bilateral: Secondary | ICD-10-CM

## 2020-10-04 DIAGNOSIS — H903 Sensorineural hearing loss, bilateral: Secondary | ICD-10-CM | POA: Diagnosis not present

## 2020-10-04 NOTE — Progress Notes (Signed)
HPI: Sandra Chapman is a 87 y.o. female who presents is referred by [facility] for evaluation of wax buildup in both ear canals.[name] uses Q tips.  No past medical history on file. No past surgical history on file. Social History   Socioeconomic History  . Marital status: Married    Spouse name: Not on file  . Number of children: Not on file  . Years of education: Not on file  . Highest education level: Not on file  Occupational History  . Not on file  Tobacco Use  . Smoking status: Never Smoker  . Smokeless tobacco: Never Used  Substance and Sexual Activity  . Alcohol use: Not on file  . Drug use: Not on file  . Sexual activity: Not on file  Other Topics Concern  . Not on file  Social History Narrative  . Not on file   Social Determinants of Health   Financial Resource Strain: Not on file  Food Insecurity: Not on file  Transportation Needs: Not on file  Physical Activity: Not on file  Stress: Not on file  Social Connections: Not on file   No family history on file. Allergies  Allergen Reactions  . Penicillins   . Sulfa Antibiotics    Prior to Admission medications   Not on File     Positive ROS: Otherwise negative  All other systems have been reviewed and were otherwise negative with the exception of those mentioned in the HPI and as above.  Physical Exam: Constitutional: Alert, well-appearing, no acute distress Ears: External ears without lesions or tenderness.  Large amount of partially impacted wax bilaterally.  This was removed with curette and forceps.  TMs are clear bilaterally. Nasal: External nose without lesions.. Clear nasal passages Oral: Lips and gums without lesions. Tongue and palate mucosa without lesions. Posterior oropharynx clear. Neck: No palpable adenopathy or masses Respiratory: Breathing comfortably  Skin: No facial/neck lesions or rash noted.  Cerumen impaction removal  Date/Time: 10/04/2020 4:53 PM Performed by:  Drema Halon, MD Authorized by: Drema Halon, MD   Consent:    Consent obtained:  Verbal   Consent given by:  Patient   Risks discussed:  Pain and bleeding Procedure details:    Location:  L ear and R ear   Procedure type: curette and forceps   Post-procedure details:    Inspection:  TM intact and canal normal   Hearing quality:  Improved   Patient tolerance of procedure:  Tolerated well, no immediate complications Comments:     TMs are clear bilaterally.    Assessment: Bilateral cerumen impactions and patient getting new hearing aids.  Plan: She will follow-up as needed.  Cautioned her about not using Q-tips in the ears and using Debrox.   Narda Bonds, MD   CC:

## 2020-12-04 ENCOUNTER — Inpatient Hospital Stay: Admission: RE | Admit: 2020-12-04 | Payer: Medicare HMO | Source: Ambulatory Visit

## 2021-01-29 ENCOUNTER — Other Ambulatory Visit: Payer: Self-pay

## 2021-01-29 ENCOUNTER — Ambulatory Visit
Admission: RE | Admit: 2021-01-29 | Discharge: 2021-01-29 | Disposition: A | Discharge status: Home / Self Care | Payer: Medicare HMO | Source: Ambulatory Visit | Attending: Internal Medicine | Admitting: Internal Medicine

## 2021-01-29 DIAGNOSIS — Z1231 Encounter for screening mammogram for malignant neoplasm of breast: Secondary | ICD-10-CM

## 2021-12-19 ENCOUNTER — Other Ambulatory Visit: Payer: Self-pay | Admitting: Internal Medicine

## 2021-12-19 DIAGNOSIS — Z1231 Encounter for screening mammogram for malignant neoplasm of breast: Secondary | ICD-10-CM

## 2022-01-30 ENCOUNTER — Ambulatory Visit
Admission: RE | Admit: 2022-01-30 | Discharge: 2022-01-30 | Disposition: A | Discharge status: Home / Self Care | Payer: Medicare HMO | Source: Ambulatory Visit | Attending: Internal Medicine | Admitting: Internal Medicine

## 2022-01-30 DIAGNOSIS — Z1231 Encounter for screening mammogram for malignant neoplasm of breast: Secondary | ICD-10-CM

## 2022-12-24 ENCOUNTER — Other Ambulatory Visit: Payer: Self-pay | Admitting: Internal Medicine

## 2022-12-24 DIAGNOSIS — Z1231 Encounter for screening mammogram for malignant neoplasm of breast: Secondary | ICD-10-CM

## 2023-02-02 ENCOUNTER — Ambulatory Visit
Admission: RE | Admit: 2023-02-02 | Discharge: 2023-02-02 | Disposition: A | Discharge status: Home / Self Care | Payer: Medicare HMO | Source: Ambulatory Visit | Attending: Internal Medicine | Admitting: Internal Medicine

## 2023-02-02 DIAGNOSIS — Z1231 Encounter for screening mammogram for malignant neoplasm of breast: Secondary | ICD-10-CM

## 2023-12-25 ENCOUNTER — Other Ambulatory Visit: Payer: Self-pay | Admitting: Internal Medicine

## 2023-12-25 DIAGNOSIS — Z0000 Encounter for general adult medical examination without abnormal findings: Secondary | ICD-10-CM

## 2024-02-03 ENCOUNTER — Ambulatory Visit
Admission: RE | Admit: 2024-02-03 | Discharge: 2024-02-03 | Disposition: A | Discharge status: Home / Self Care | Source: Ambulatory Visit | Attending: Internal Medicine | Admitting: Internal Medicine

## 2024-02-03 DIAGNOSIS — Z0000 Encounter for general adult medical examination without abnormal findings: Secondary | ICD-10-CM
